# Patient Record
Sex: Male | Born: 1950 | Race: White | Hispanic: No | Marital: Single
Health system: Southern US, Community
[De-identification: ages and names within clinical notes are randomized; demographics above are authoritative.]

## PROBLEM LIST (undated history)

## (undated) DIAGNOSIS — I10 Essential (primary) hypertension: Secondary | ICD-10-CM

## (undated) DIAGNOSIS — K219 Gastro-esophageal reflux disease without esophagitis: Secondary | ICD-10-CM

## (undated) HISTORY — PX: ROUX-EN-Y GASTRIC BYPASS: SHX1104

---

## 2018-05-02 ENCOUNTER — Emergency Department
Admission: EM | Admit: 2018-05-02 | Discharge: 2018-05-03 | Disposition: A | Discharge status: Home / Self Care | Payer: Medicare (Managed Care) | Attending: Emergency Medicine | Admitting: Emergency Medicine

## 2018-05-02 ENCOUNTER — Emergency Department: Payer: Medicare (Managed Care)

## 2018-05-02 ENCOUNTER — Other Ambulatory Visit: Payer: Self-pay

## 2018-05-02 DIAGNOSIS — K251 Acute gastric ulcer with perforation: Secondary | ICD-10-CM | POA: Insufficient documentation

## 2018-05-02 DIAGNOSIS — Z9884 Bariatric surgery status: Secondary | ICD-10-CM | POA: Insufficient documentation

## 2018-05-02 DIAGNOSIS — R109 Unspecified abdominal pain: Secondary | ICD-10-CM | POA: Diagnosis present

## 2018-05-02 DIAGNOSIS — I10 Essential (primary) hypertension: Secondary | ICD-10-CM | POA: Diagnosis not present

## 2018-05-02 DIAGNOSIS — K255 Chronic or unspecified gastric ulcer with perforation: Secondary | ICD-10-CM

## 2018-05-02 HISTORY — DX: Gastro-esophageal reflux disease without esophagitis: K21.9

## 2018-05-02 HISTORY — DX: Essential (primary) hypertension: I10

## 2018-05-02 LAB — CBC
HCT: 40 % (ref 40.0–52.0)
Hemoglobin: 13.1 g/dL (ref 13.0–18.0)
MCH: 26.5 pg (ref 26.0–34.0)
MCHC: 32.8 g/dL (ref 32.0–36.0)
MCV: 80.7 fL (ref 80.0–100.0)
PLATELETS: 387 10*3/uL (ref 150–440)
RBC: 4.96 MIL/uL (ref 4.40–5.90)
RDW: 19.8 % — ABNORMAL HIGH (ref 11.5–14.5)
WBC: 10.7 10*3/uL — AB (ref 3.8–10.6)

## 2018-05-02 LAB — URINALYSIS, COMPLETE (UACMP) WITH MICROSCOPIC
Bacteria, UA: NONE SEEN
Bilirubin Urine: NEGATIVE
GLUCOSE, UA: NEGATIVE mg/dL
Hgb urine dipstick: NEGATIVE
KETONES UR: NEGATIVE mg/dL
Leukocytes, UA: NEGATIVE
Nitrite: NEGATIVE
PH: 5 (ref 5.0–8.0)
Protein, ur: NEGATIVE mg/dL
Specific Gravity, Urine: 1.011 (ref 1.005–1.030)
Squamous Epithelial / LPF: NONE SEEN (ref 0–5)

## 2018-05-02 LAB — COMPREHENSIVE METABOLIC PANEL
ALK PHOS: 94 U/L (ref 38–126)
ALT: 18 U/L (ref 0–44)
AST: 24 U/L (ref 15–41)
Albumin: 4.3 g/dL (ref 3.5–5.0)
Anion gap: 8 (ref 5–15)
BUN: 24 mg/dL — ABNORMAL HIGH (ref 8–23)
CO2: 24 mmol/L (ref 22–32)
CREATININE: 1.46 mg/dL — AB (ref 0.61–1.24)
Calcium: 9.1 mg/dL (ref 8.9–10.3)
Chloride: 103 mmol/L (ref 98–111)
GFR calc non Af Amer: 49 mL/min — ABNORMAL LOW (ref >60)
GFR, EST AFRICAN AMERICAN: 56 mL/min — AB (ref >60)
Glucose, Bld: 116 mg/dL — ABNORMAL HIGH (ref 70–99)
Potassium: 4.8 mmol/L (ref 3.5–5.1)
Sodium: 135 mmol/L (ref 135–145)
Total Bilirubin: 0.5 mg/dL (ref 0.3–1.2)
Total Protein: 7.6 g/dL (ref 6.5–8.1)

## 2018-05-02 MED ORDER — SODIUM CHLORIDE 0.9 % IV BOLUS
1000.0000 mL | Freq: Once | INTRAVENOUS | Status: AC
  Administered 2018-05-02: 1000 mL via INTRAVENOUS

## 2018-05-02 MED ORDER — IOPAMIDOL (ISOVUE-300) INJECTION 61%
80.0000 mL | Freq: Once | INTRAVENOUS | Status: AC | PRN
  Administered 2018-05-02: 80 mL via INTRAVENOUS

## 2018-05-02 MED ORDER — HYDROMORPHONE HCL 1 MG/ML IJ SOLN
0.5000 mg | Freq: Once | INTRAMUSCULAR | Status: AC
  Administered 2018-05-02: 0.5 mg via INTRAVENOUS

## 2018-05-02 MED ORDER — MORPHINE SULFATE (PF) 4 MG/ML IV SOLN
4.0000 mg | Freq: Once | INTRAVENOUS | Status: AC
  Administered 2018-05-03: 4 mg via INTRAVENOUS
  Filled 2018-05-02: qty 1

## 2018-05-02 MED ORDER — ONDANSETRON HCL 4 MG/2ML IJ SOLN
4.0000 mg | Freq: Once | INTRAMUSCULAR | Status: AC
  Administered 2018-05-02: 4 mg via INTRAVENOUS

## 2018-05-02 MED ORDER — MORPHINE SULFATE (PF) 4 MG/ML IV SOLN
INTRAVENOUS | Status: AC
  Filled 2018-05-02: qty 1

## 2018-05-02 MED ORDER — HYDROMORPHONE HCL 1 MG/ML IJ SOLN
INTRAMUSCULAR | Status: AC
  Filled 2018-05-02: qty 1

## 2018-05-02 MED ORDER — HYDROMORPHONE HCL 1 MG/ML IJ SOLN: 1.0000 mg | Freq: Once | INTRAMUSCULAR | Status: DC

## 2018-05-02 MED ORDER — ONDANSETRON HCL 4 MG/2ML IJ SOLN
INTRAMUSCULAR | Status: AC
  Filled 2018-05-02: qty 2

## 2018-05-02 MED ORDER — PIPERACILLIN-TAZOBACTAM 3.375 G IVPB 30 MIN
3.3750 g | Freq: Once | INTRAVENOUS | Status: AC
  Administered 2018-05-03: 3.375 g via INTRAVENOUS
  Filled 2018-05-02: qty 50

## 2018-05-02 MED ORDER — ONDANSETRON HCL 4 MG/2ML IJ SOLN
4.0000 mg | Freq: Once | INTRAMUSCULAR | Status: AC
  Administered 2018-05-02: 4 mg via INTRAVENOUS
  Filled 2018-05-02: qty 2

## 2018-05-02 NOTE — ED Notes (Signed)
Patient transported to CT 

## 2018-05-02 NOTE — ED Notes (Signed)
Pt reports abdominal pain is now more centralized. Bowel sounds greater on the right side. Pt appears more calm.

## 2018-05-02 NOTE — ED Triage Notes (Signed)
Pt states two weeks of severe abdominal pain. Pt states is llq. Pt states he is vomiting. Pt denies difficulty urinating. Last bowel movement was yesterday.

## 2018-05-02 NOTE — ED Provider Notes (Addendum)
[facility] Emergency Department Provider Note  ____________________________________________  Time seen: Approximately 10:13 PM  I have reviewed the triage vital signs and the nursing notes.   HISTORY  Chief Complaint Abdominal Pain    HPI Dennis Silva is a 67 y.o. male with a remote history of gastric bypass, cholecystectomy, presenting with abdominal pain, nausea and vomiting.  The patient reports that for the past 2 weeks he has had a progressively worsening pain which initially started in the left lower quadrant, and it now is diffuse.  He is also having multiple episodes of nausea and vomiting daily.  His last normal bowel movement was yesterday and he is passing gas.  He has not had fevers or chills, urinary symptoms.  He has tried Pepto-Bismol for his pain which initially helped but is no longer helpful at this time.   Past Medical History:  Diagnosis Date  . Hypertension     There are no active problems to display for this patient.     Current Outpatient Rx  . Order #: [id number] Class: Historical Med  . Order #: [id number] Class: Historical Med  . Order #: [id number] Class: Historical Med  . Order #: [id number] Class: Historical Med  . Order #: [id number] Class: Historical Med  . Order #: [id number] Class: Historical Med  . Order #: [id number] Class: Historical Med  . Order #: [id number] Class: Historical Med  . Order #: [id number] Class: Historical Med    Allergies Primaxin [imipenem]  No family history on file.  Social History Social History   Tobacco Use  . Smoking status: Not on file  Substance Use Topics  . Alcohol use: Not on file  . Drug use: Not on file    Review of Systems Constitutional: No fever/chills.  No lightheadedness or syncope. Eyes: No visual changes. ENT: No sore throat. No congestion or rhinorrhea. Cardiovascular: Denies chest pain. Denies palpitations. Respiratory: Denies shortness of breath.  No cough. Gastrointestinal:  Positive abdominal pain.  Positive nausea, positive vomiting.  No diarrhea.  No constipation. Genitourinary: Negative for dysuria. Musculoskeletal: Negative for back pain. Skin: Negative for rash. Neurological: Negative for headaches. No focal numbness, tingling or weakness.     ____________________________________________   PHYSICAL EXAM:  VITAL SIGNS: ED Triage Vitals [05/02/18 2035]  Enc Vitals Group     BP (!) 154/101     Pulse Rate 86     Resp 16     Temp 97.9 F (36.6 C)     Temp Source Oral     SpO2 98 %     Weight 170 lb (77.1 kg)     Height 5\' 8"  (1.727 m)     Head Circumference      Peak Flow      Pain Score 10     Pain Loc      Pain Edu?      Excl. in GC?     Constitutional: Alert and oriented. Answers questions appropriately.  Uncomfortable appearing. Eyes: Conjunctivae are normal.  EOMI. No scleral icterus. Head: Atraumatic. Nose: No congestion/rhinnorhea. Mouth/Throat: Mucous membranes are dry.  Neck: No stridor.  Supple.   Cardiovascular: Normal rate, regular rhythm. No murmurs, rubs or gallops.  Respiratory: Normal respiratory effort.  No accessory muscle use or retractions. Lungs CTAB.  No wheezes, rales or ronchi. Gastrointestinal: Soft, and nondistended.  Tender to palpation in the periumbilical area.    No guarding or rebound.  No peritoneal signs. Musculoskeletal: No LE edema. No ttp in the calves or palpable cords.  Negative Homan's sign.  Neurologic:  A&Ox3.  Speech is clear.  Face and smile are symmetric.  EOMI.  Moves all extremities well. Skin:  Skin is warm, dry and intact. No rash noted. Psychiatric: Mood and affect are normal. Speech and behavior are normal.  Normal judgement. ____________________________________________   LABS (all labs ordered are listed, but only abnormal results are displayed)  Labs Reviewed  COMPREHENSIVE METABOLIC PANEL - Abnormal; Notable for the following components:      Result Value   Glucose, Bld 116 (*)     BUN 24 (*)    Creatinine, Ser 1.46 (*)    GFR calc non Af Amer 49 (*)    GFR calc Af Amer 56 (*)    All other components within normal limits  CBC - Abnormal; Notable for the following components:   WBC 10.7 (*)    RDW 19.8 (*)    All other components within normal limits  URINALYSIS, COMPLETE (UACMP) WITH MICROSCOPIC - Abnormal; Notable for the following components:   Color, Urine YELLOW (*)    APPearance CLEAR (*)    All other components within normal limits  LACTIC ACID, PLASMA  LACTIC ACID, PLASMA   ____________________________________________  EKG  ED ECG REPORT I, Anne-Caroline Sharma Covert, the attending physician, personally viewed and interpreted this ECG.   Date: 05/02/2018  EKG Time: 0013  Rate: 72  Rhythm: normal sinus rhythm; RBBB  Axis: normal  Intervals:prolonged QTc  ST&T Change: No STEMI; poor baseline tracing  ____________________________________________  RADIOLOGY  Ct Abdomen Pelvis W Contrast  Result Date: 05/02/2018 CLINICAL DATA:  Generalized abdominal pain EXAM: CT ABDOMEN AND PELVIS WITH CONTRAST TECHNIQUE: Multidetector CT imaging of the abdomen and pelvis was performed using the standard protocol following bolus administration of intravenous contrast. CONTRAST:  80mL ISOVUE-300 IOPAMIDOL (ISOVUE-300) INJECTION 61% COMPARISON:  None. FINDINGS: Lower chest: Lung bases demonstrate no acute consolidation or pleural effusion. The heart size is within normal limits. Moderate hiatal hernia. Postsurgical changes at the GE junction/proximal stomach with wall thickening and mild edema. Hepatobiliary: Status post cholecystectomy. Mild intra and extrahepatic biliary dilatation likely due to surgical change. Pancreas: Unremarkable. No pancreatic ductal dilatation or surrounding inflammatory changes. Spleen: Normal in size without focal abnormality. Adrenals/Urinary Tract: Adrenal glands are within normal limits. No hydronephrosis. Small nonobstructing stones in the mid to  lower left kidney. Subcentimeter hypodense renal lesions too small to further characterize. The bladder is Stomach/Bowel: Status post gastric bypass. Small focal gas collection at the cephalad margin of the gastric pouch. No colon wall thickening. No dilated small bowel. Postsurgical changes of left lower quadrant small bowel. Negative appendix. Vascular/Lymphatic: Nonaneurysmal aorta.  No significant adenopathy Reproductive: Prostate is unremarkable. Other: No free air.  Prior ventral hernia repair. Musculoskeletal: Degenerative changes. No acute or suspicious abnormality IMPRESSION: 1. Status post gastric bypass surgery. There is a moderate hiatal hernia with surgical sutures in the region of GE junction. Wall thickening and mild edema in the GE junction region. Small focal gas bubble in the region, not certain if this is intraluminal or extraluminal; given surrounding wall thickening and inflammatory changes, findings raise possibility of a small contained perforation. There is no free air. 2. Mild intra and extrahepatic biliary dilatation status post cholecystectomy, likely due to surgical change. 3. There are no other acute intra-abdominal or pelvic abnormalities visualized. 4. Nonobstructing stones in the left kidney. Electronically Signed   By: Jasmine Pang M.D.   On: 05/02/2018 23:04    ____________________________________________   PROCEDURES  Procedure(s) performed: None  Procedures  Critical Care performed: Yes ____________________________________________   INITIAL IMPRESSION / ASSESSMENT AND PLAN / ED COURSE  Pertinent labs & imaging results that were available during my care of the patient were reviewed by me and considered in my medical decision making (see chart for details).  67 y.o. male with a history of gastric bypass and cholecystectomy presenting with left lower quadrant, now diffuse abdominal pain with nausea and vomiting.  Overall, the patient is mildly hypertensive at  182/137 but is afebrile.  He has an abdominal examination patient which does show some tenderness around the periumbilical area.  My differential includes partial small bowel obstruction, diverticulitis with complication, UTI, renal colic.  The patient will undergo CT examination and continued symptomatic treatment.  ----------------------------------------- 11:37 PM on 05/02/2018 -----------------------------------------  The patient has had treatment with Dilaudid x2 and Zofran x2 and states he continues to be symptomatic without significant improvement.  I will continue to be aggressive about his pain management.  His CT scan does show concern for a perforation and I have called Dr. Earlene Plater, the general surgeon on-call to evaluate the patient.  Due to this being a possible complication around gastric bypass surgery area, Dr. Earlene Plater recommends transfer to [facility] and I have initiated the call.  ----------------------------------------- 12:34 AM on 05/03/2018 -----------------------------------------  The patient is continuing of pain, 8 out of 10, so we will try some morphine to see if that helps more than the Dilaudid.  The patient has been seen and evaluated by the surgeon here, who is recommended a PPI, but is unable to admit him because if he has additional complications there is no bariatric surgeon at [facility].  I have also talked to the surgeon at [facility] who cannot accept the patient for the same reason.  At [facility], the entire hospital is full and their ED is full so they cannot accept any additional patients at this time.  At [facility], the patient has been accepted by Dr. Ernesta Amble for continued evaluation and treatment.  ____________________________________________  FINAL CLINICAL IMPRESSION(S) / ED DIAGNOSES  Final diagnoses:  Gastric perforation (HCC)         NEW MEDICATIONS STARTED DURING THIS VISIT:  New Prescriptions   No medications on file      Rockne Menghini,  MD 05/03/18 0015    Rockne Menghini, MD 05/03/18 [phone]
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
## 2018-05-03 ENCOUNTER — Encounter: Payer: Self-pay | Admitting: Surgery

## 2018-05-03 DIAGNOSIS — K251 Acute gastric ulcer with perforation: Secondary | ICD-10-CM | POA: Diagnosis not present

## 2018-05-03 MED ORDER — PANTOPRAZOLE SODIUM 40 MG IV SOLR: 40.0000 mg | Freq: Two times a day (BID) | INTRAVENOUS | Status: DC

## 2018-05-03 MED ORDER — SODIUM CHLORIDE 0.9 % IV SOLN
8.0000 mg/h | INTRAVENOUS | Status: DC
  Administered 2018-05-03: 8 mg/h via INTRAVENOUS
  Filled 2018-05-03: qty 80

## 2018-05-03 MED ORDER — SODIUM CHLORIDE 0.9 % IV SOLN
80.0000 mg | Freq: Once | INTRAVENOUS | Status: AC
  Administered 2018-05-03: 80 mg via INTRAVENOUS
  Filled 2018-05-03: qty 80

## 2018-05-03 NOTE — ED Notes (Signed)
Pt asking when he can have more pain medicine. Informed pt PRN.

## 2018-05-03 NOTE — ED Notes (Signed)
[facility] called for transport to [facility] Rm [phone]
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
## 2018-05-26 DIAGNOSIS — K251 Acute gastric ulcer with perforation: Secondary | ICD-10-CM | POA: Insufficient documentation

## 2020-03-22 IMAGING — CT CT ABD-PELV W/ CM
2 of 5 series · 15 of 46 positions shown, 17 images · IV contrast (APPLIED)
Comparison: None.

CLINICAL DATA: Generalized abdominal pain

EXAM:
CT ABDOMEN AND PELVIS WITH CONTRAST
TECHNIQUE: Multidetector CT imaging of the abdomen and pelvis was performed
using the standard protocol following bolus administration of
intravenous contrast.
CONTRAST:  80mL B9JPSE-6VV IOPAMIDOL (B9JPSE-6VV) INJECTION 61%

[Series 2: axial st · axial · 0.75mm/px · z∈[-464,-14]mm · 12 of 102 slices shown, 14 images]
[im 6/102  soft-tissue]
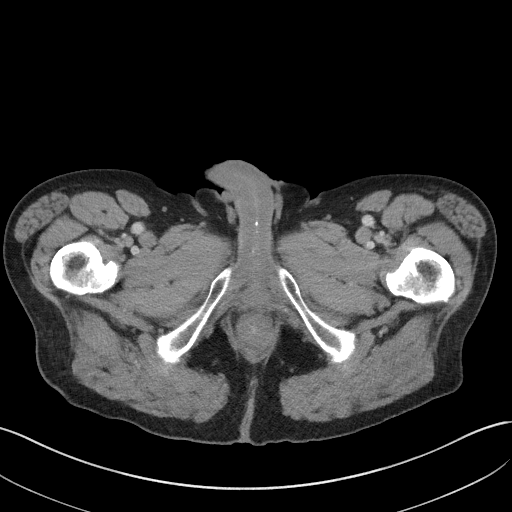
[im 6/102  bone]
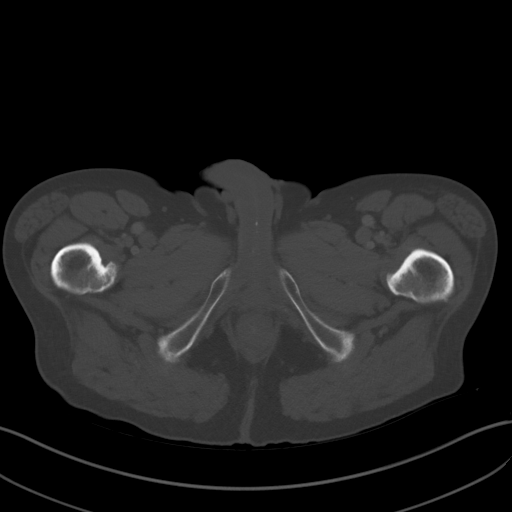
[im 16/102  soft-tissue]
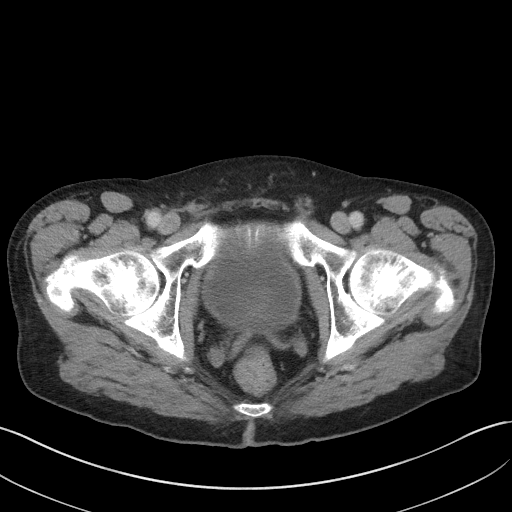
[im 22/102  soft-tissue]
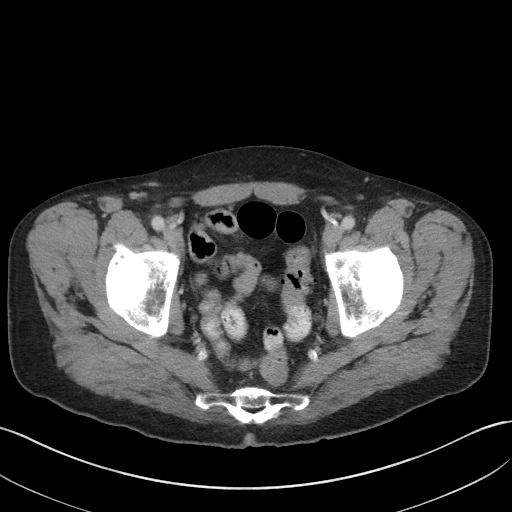
[im 32/102  soft-tissue]
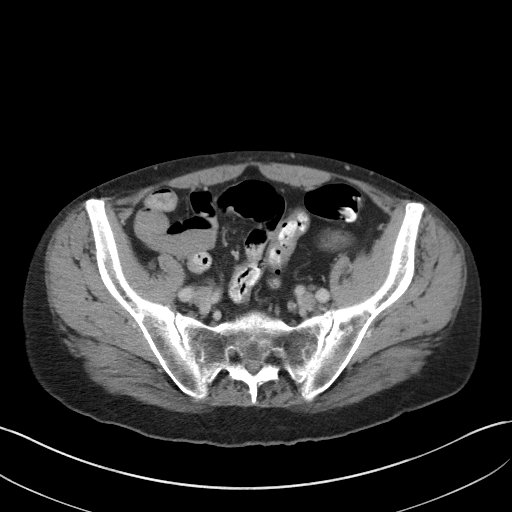
[im 38/102  soft-tissue]
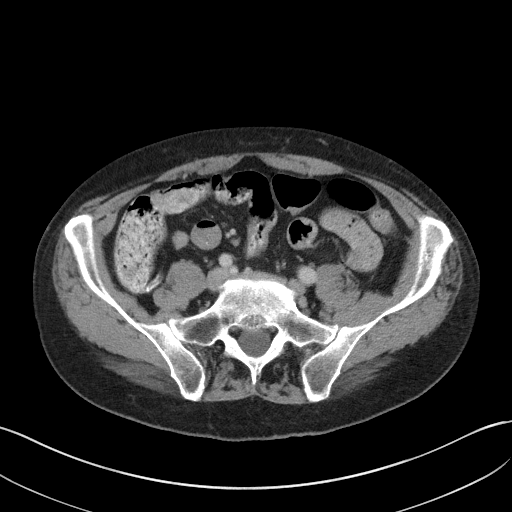
[im 48/102  soft-tissue]
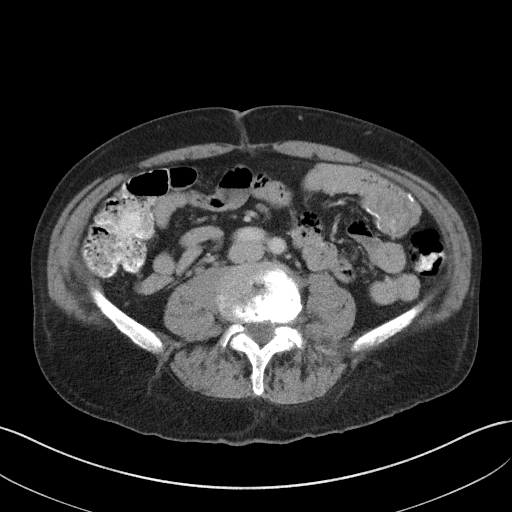
[im 54/102  soft-tissue]
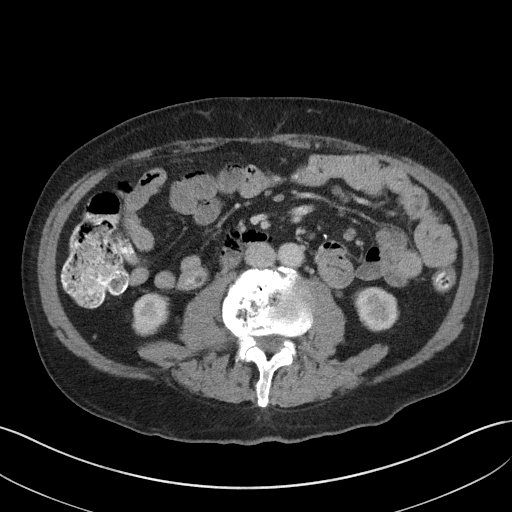
[im 64/102  soft-tissue]
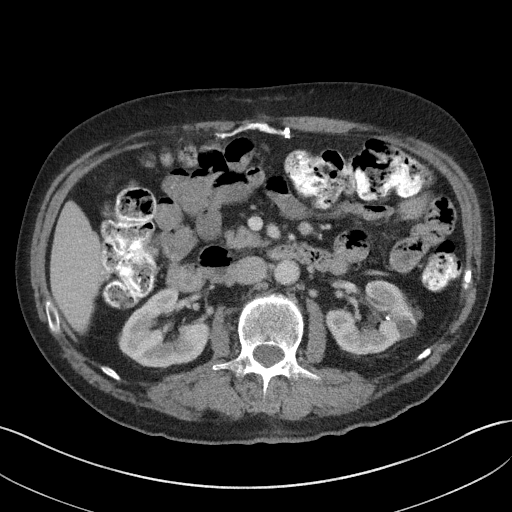
[im 70/102  soft-tissue]
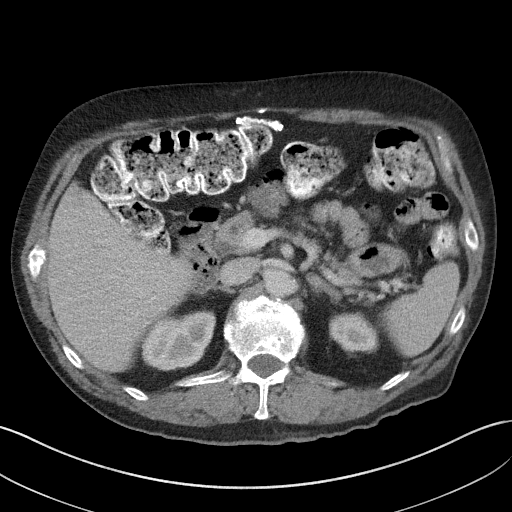
[im 70/102  bone]
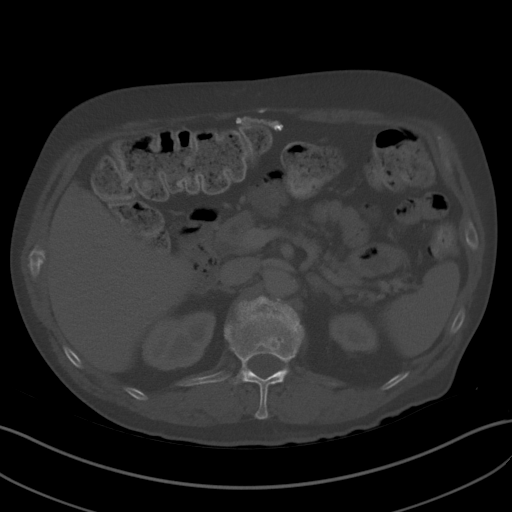
[im 80/102  soft-tissue]
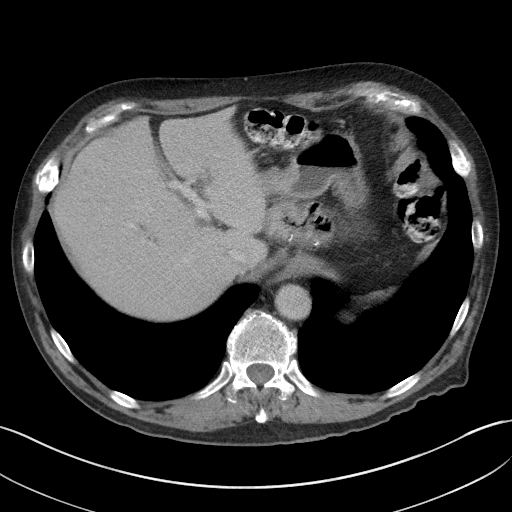
[im 86/102  soft-tissue]
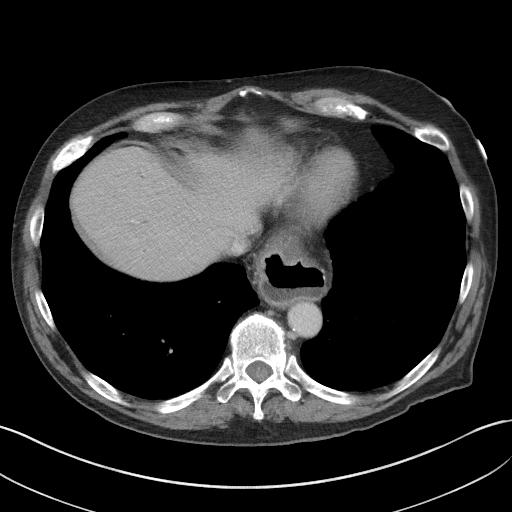
[im 96/102  soft-tissue]
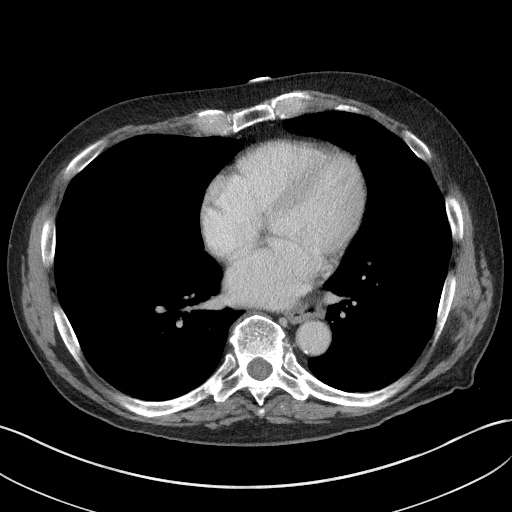

[Series 5: coronal st · coronal · 0.74mm/px · 3 of 85 slices shown]
[im 29/85  soft-tissue]
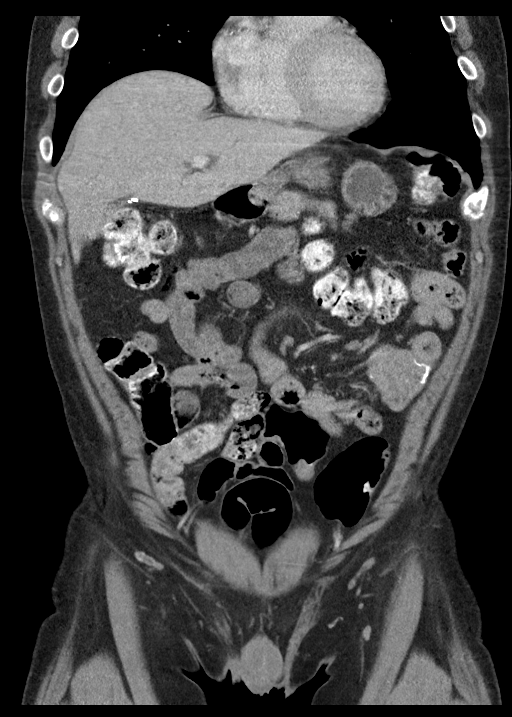
[im 38/85  soft-tissue]
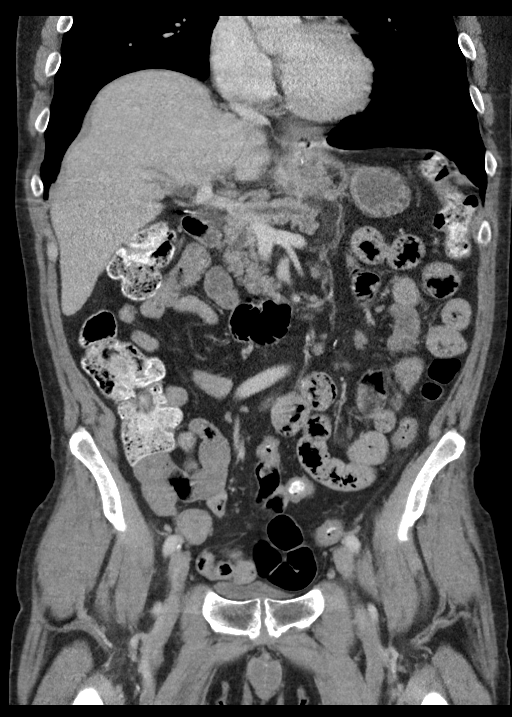
[im 47/85  soft-tissue]
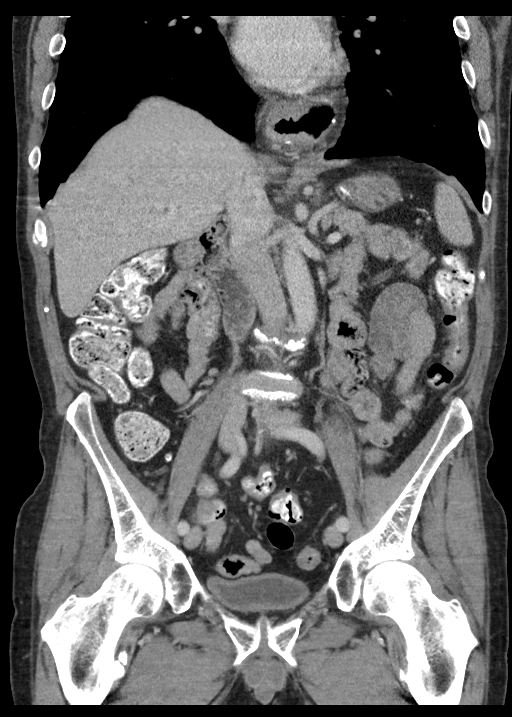

[15 of 46 positions shown; findings below may reference images not displayed]

FINDINGS: Lower chest: Lung bases demonstrate no acute consolidation or
pleural effusion. The heart size is within normal limits. Moderate
hiatal hernia. Postsurgical changes at the GE junction/proximal
stomach with wall thickening and mild edema.

Hepatobiliary: Status post cholecystectomy. Mild intra and
extrahepatic biliary dilatation likely due to surgical change.

Pancreas: Unremarkable. No pancreatic ductal dilatation or
surrounding inflammatory changes.

Spleen: Normal in size without focal abnormality.

Adrenals/Urinary Tract: Adrenal glands are within normal limits. No
hydronephrosis. Small nonobstructing stones in the mid to lower left
kidney. Subcentimeter hypodense renal lesions too small to further
characterize. The bladder is

Stomach/Bowel: Status post gastric bypass. Small focal gas
collection at the cephalad margin of the gastric pouch. No colon
wall thickening. No dilated small bowel. Postsurgical changes of
left lower quadrant small bowel. Negative appendix.

Vascular/Lymphatic: Nonaneurysmal aorta.  No significant adenopathy

Reproductive: Prostate is unremarkable.

Other: No free air.  Prior ventral hernia repair.

Musculoskeletal: Degenerative changes. No acute or suspicious
abnormality
IMPRESSION: 1. Status post gastric bypass surgery. There is a moderate hiatal
hernia with surgical sutures in the region of GE junction. Wall
thickening and mild edema in the GE junction region. Small focal gas
bubble in the region, not certain if this is intraluminal or
extraluminal; given surrounding wall thickening and inflammatory
changes, findings raise possibility of a small contained
perforation. There is no free air.
2. Mild intra and extrahepatic biliary dilatation status post
cholecystectomy, likely due to surgical change.
3. There are no other acute intra-abdominal or pelvic abnormalities
visualized.
4. Nonobstructing stones in the left kidney.
